# Patient Record
Sex: Male | Born: 1940 | Race: White | Hispanic: No | Marital: Single | Smoking: Former smoker
Health system: Southern US, Community
[De-identification: ages and names within clinical notes are randomized; demographics above are authoritative.]

## PROBLEM LIST (undated history)

## (undated) DIAGNOSIS — C801 Malignant (primary) neoplasm, unspecified: Secondary | ICD-10-CM

## (undated) DIAGNOSIS — I509 Heart failure, unspecified: Secondary | ICD-10-CM

## (undated) DIAGNOSIS — R0600 Dyspnea, unspecified: Secondary | ICD-10-CM

## (undated) DIAGNOSIS — N289 Disorder of kidney and ureter, unspecified: Secondary | ICD-10-CM

## (undated) DIAGNOSIS — J449 Chronic obstructive pulmonary disease, unspecified: Secondary | ICD-10-CM

## (undated) HISTORY — PX: DIALYSIS FISTULA CREATION: SHX611

## (undated) HISTORY — PX: PROSTATE BIOPSY: SHX241

---

## 2017-08-26 ENCOUNTER — Inpatient Hospital Stay (HOSPITAL_COMMUNITY)
Admission: EM | Admit: 2017-08-26 | Discharge: 2017-08-28 | DRG: 189 | Disposition: A | Discharge status: Home / Self Care | Payer: Medicare Other | Attending: Nephrology | Admitting: Nephrology

## 2017-08-26 ENCOUNTER — Emergency Department (HOSPITAL_COMMUNITY): Payer: Medicare Other

## 2017-08-26 ENCOUNTER — Encounter (HOSPITAL_COMMUNITY): Payer: Self-pay | Admitting: Emergency Medicine

## 2017-08-26 DIAGNOSIS — R0602 Shortness of breath: Secondary | ICD-10-CM | POA: Diagnosis not present

## 2017-08-26 DIAGNOSIS — I2510 Atherosclerotic heart disease of native coronary artery without angina pectoris: Secondary | ICD-10-CM | POA: Diagnosis present

## 2017-08-26 DIAGNOSIS — Z7982 Long term (current) use of aspirin: Secondary | ICD-10-CM

## 2017-08-26 DIAGNOSIS — J449 Chronic obstructive pulmonary disease, unspecified: Secondary | ICD-10-CM | POA: Diagnosis present

## 2017-08-26 DIAGNOSIS — Z87891 Personal history of nicotine dependence: Secondary | ICD-10-CM

## 2017-08-26 DIAGNOSIS — I509 Heart failure, unspecified: Secondary | ICD-10-CM

## 2017-08-26 DIAGNOSIS — Z8249 Family history of ischemic heart disease and other diseases of the circulatory system: Secondary | ICD-10-CM

## 2017-08-26 DIAGNOSIS — I10 Essential (primary) hypertension: Secondary | ICD-10-CM

## 2017-08-26 DIAGNOSIS — J810 Acute pulmonary edema: Principal | ICD-10-CM | POA: Diagnosis present

## 2017-08-26 DIAGNOSIS — Z79899 Other long term (current) drug therapy: Secondary | ICD-10-CM

## 2017-08-26 DIAGNOSIS — N186 End stage renal disease: Secondary | ICD-10-CM | POA: Diagnosis present

## 2017-08-26 DIAGNOSIS — I132 Hypertensive heart and chronic kidney disease with heart failure and with stage 5 chronic kidney disease, or end stage renal disease: Secondary | ICD-10-CM | POA: Diagnosis present

## 2017-08-26 DIAGNOSIS — J9690 Respiratory failure, unspecified, unspecified whether with hypoxia or hypercapnia: Secondary | ICD-10-CM | POA: Diagnosis present

## 2017-08-26 DIAGNOSIS — Z7951 Long term (current) use of inhaled steroids: Secondary | ICD-10-CM

## 2017-08-26 DIAGNOSIS — Z992 Dependence on renal dialysis: Secondary | ICD-10-CM

## 2017-08-26 DIAGNOSIS — J811 Chronic pulmonary edema: Secondary | ICD-10-CM | POA: Diagnosis present

## 2017-08-26 DIAGNOSIS — C3491 Malignant neoplasm of unspecified part of right bronchus or lung: Secondary | ICD-10-CM | POA: Diagnosis not present

## 2017-08-26 DIAGNOSIS — J9601 Acute respiratory failure with hypoxia: Secondary | ICD-10-CM | POA: Diagnosis present

## 2017-08-26 HISTORY — DX: Dyspnea, unspecified: R06.00

## 2017-08-26 HISTORY — DX: Disorder of kidney and ureter, unspecified: N28.9

## 2017-08-26 HISTORY — DX: Heart failure, unspecified: I50.9

## 2017-08-26 HISTORY — DX: Chronic obstructive pulmonary disease, unspecified: J44.9

## 2017-08-26 HISTORY — DX: Malignant (primary) neoplasm, unspecified: C80.1

## 2017-08-26 LAB — COMPREHENSIVE METABOLIC PANEL
ALT: 11 U/L — ABNORMAL LOW (ref 17–63)
AST: 18 U/L (ref 15–41)
Albumin: 3.9 g/dL (ref 3.5–5.0)
Alkaline Phosphatase: 115 U/L (ref 38–126)
Anion gap: 14 (ref 5–15)
BILIRUBIN TOTAL: 0.6 mg/dL (ref 0.3–1.2)
BUN: 36 mg/dL — ABNORMAL HIGH (ref 6–20)
CHLORIDE: 103 mmol/L (ref 101–111)
CO2: 22 mmol/L (ref 22–32)
CREATININE: 7.23 mg/dL — AB (ref 0.61–1.24)
Calcium: 8.8 mg/dL — ABNORMAL LOW (ref 8.9–10.3)
GFR calc non Af Amer: 6 mL/min — ABNORMAL LOW (ref >60)
GFR, EST AFRICAN AMERICAN: 7 mL/min — AB (ref >60)
Glucose, Bld: 123 mg/dL — ABNORMAL HIGH (ref 65–99)
POTASSIUM: 3.8 mmol/L (ref 3.5–5.1)
Sodium: 139 mmol/L (ref 135–145)
TOTAL PROTEIN: 7.1 g/dL (ref 6.5–8.1)

## 2017-08-26 LAB — CBC WITH DIFFERENTIAL/PLATELET
Basophils Absolute: 0.1 10*3/uL (ref 0.0–0.1)
Basophils Relative: 1 %
EOS PCT: 4 %
Eosinophils Absolute: 0.3 10*3/uL (ref 0.0–0.7)
HCT: 31.1 % — ABNORMAL LOW (ref 39.0–52.0)
Hemoglobin: 10.6 g/dL — ABNORMAL LOW (ref 13.0–17.0)
LYMPHS ABS: 0.7 10*3/uL (ref 0.7–4.0)
Lymphocytes Relative: 9 %
MCH: 29.9 pg (ref 26.0–34.0)
MCHC: 34.1 g/dL (ref 30.0–36.0)
MCV: 87.9 fL (ref 78.0–100.0)
MONO ABS: 0.3 10*3/uL (ref 0.1–1.0)
MONOS PCT: 4 %
Neutro Abs: 6 10*3/uL (ref 1.7–7.7)
Neutrophils Relative %: 82 %
PLATELETS: 160 10*3/uL (ref 150–400)
RBC: 3.54 MIL/uL — ABNORMAL LOW (ref 4.22–5.81)
RDW: 15.9 % — AB (ref 11.5–15.5)
WBC: 7.4 10*3/uL (ref 4.0–10.5)

## 2017-08-26 LAB — I-STAT CHEM 8, ED
BUN: 40 mg/dL — AB (ref 6–20)
CHLORIDE: 103 mmol/L (ref 101–111)
Calcium, Ion: 1.05 mmol/L — ABNORMAL LOW (ref 1.15–1.40)
Creatinine, Ser: 7.4 mg/dL — ABNORMAL HIGH (ref 0.61–1.24)
Glucose, Bld: 120 mg/dL — ABNORMAL HIGH (ref 65–99)
HEMATOCRIT: 30 % — AB (ref 39.0–52.0)
Hemoglobin: 10.2 g/dL — ABNORMAL LOW (ref 13.0–17.0)
Potassium: 3.8 mmol/L (ref 3.5–5.1)
SODIUM: 141 mmol/L (ref 135–145)
TCO2: 27 mmol/L (ref 22–32)

## 2017-08-26 MED ORDER — ALBUTEROL SULFATE (2.5 MG/3ML) 0.083% IN NEBU: 5.0000 mg | INHALATION_SOLUTION | Freq: Once | RESPIRATORY_TRACT | Status: DC

## 2017-08-26 NOTE — ED Triage Notes (Signed)
Pt presents EMS for shortness of breath. Pt is dialysis and goes MWF has not missed a treatment. Pt states started this AM and has gotten worse with exertional dyspnea and increased edema

## 2017-08-26 NOTE — ED Notes (Signed)
Patient transported to X-ray 

## 2017-08-26 NOTE — H&P (Signed)
[facility]                                                                                    Patient Demographics  Patrick Molina, is a 76 y.o. male  CSN: [id number]  MRN: [id number]  DOB - 10/14/1941  Admit Date - 08/26/2017  Outpatient Primary MD for the patient is No primary care provider on file.   With History of -  Past Medical History:  Diagnosis Date  . Cancer ([place])   . CHF (congestive heart failure) ([place])   . COPD (chronic obstructive pulmonary disease) ([place])   . Renal disorder       No past surgical history on file.  in for   Chief Complaint  Patient presents with  . Shortness of Breath     HPI  Patrick Molina  is a 76 y.o. male, With past medical history significant for end-stage renal disease on hemodialysis Monday Wednesday Friday presenting one day history of increasing shortness of breath and increased leg swelling. Patient denies consuming excessive amounts of fluid or alcohol. Patient denies any chest pains, nausea, vomiting, fever or chills. Patient denies cough. He usually follows at [facility] which was on divert. His shortness of breath improved significantly on Ventimask .    Review of Systems    In addition to the HPI above,  No Fever-chills, No Headache, No changes with Vision or hearing, No problems swallowing food or Liquids, No Chest pain, Cough  No Abdominal pain, No Nausea or Vommitting, Bowel movements are regular, No Blood in stool or Urine, No dysuria, No new skin rashes or bruises, No new joints pains-aches,  No new weakness, tingling, numbness in any extremity, No polyuria, polydypsia or polyphagia, No significant Mental Stressors.  A full 10 point Review of Systems was done, except as stated above, all other Review of Systems were negative.   Social History History of smoking, stopped 4 years ago No history of alcoholism or drug abuse   Family History Hypertension and heart disease   Prior to Admission  medications   Medication Sig Start Date End Date Taking? Authorizing Provider  albuterol (PROVENTIL HFA;VENTOLIN HFA) 108 (90 Base) MCG/ACT inhaler Inhale 2 puffs into the lungs every 6 (six) hours as needed for wheezing or shortness of breath.   Yes [provider]  amLODipine (NORVASC) 10 MG tablet Take 10 mg by mouth daily. 08/22/17  Yes [provider]  aspirin EC 81 MG tablet Take 81 mg by mouth daily.   Yes [provider]  busPIRone (BUSPAR) 5 MG tablet Take 5 mg by mouth 2 (two) times daily. 08/14/17  Yes [provider]  carvedilol (COREG) 6.25 MG tablet Take 6.25 mg by mouth 2 (two) times daily. 08/09/17  Yes [provider]  doxazosin (CARDURA) 4 MG tablet Take 4 mg by mouth at bedtime. 07/16/17  Yes [provider]  ferric citrate (AURYXIA) 1 GM 210 MG(Fe) tablet Take 420 mg by mouth daily.   Yes [provider]  fluticasone furoate-vilanterol (BREO ELLIPTA) 100-25 MCG/INH AEPB Inhale 1 puff into the lungs as needed.    Yes [provider]  furosemide (  LASIX) 40 MG tablet Take 40 mg by mouth daily. 08/09/17  Yes [provider]  isosorbide mononitrate (IMDUR) 120 MG 24 hr tablet Take 120 mg by mouth daily. 08/07/17  Yes [provider]  LORazepam (ATIVAN) 0.5 MG tablet Take 0.5 mg by mouth daily as needed. 08/23/17  Yes [provider]  losartan (COZAAR) 100 MG tablet Take 100 mg by mouth daily. 07/11/17  Yes [provider]  pantoprazole (PROTONIX) 40 MG tablet Take 40 mg by mouth daily. 08/09/17  Yes [provider]  SYMBICORT 80-4.5 MCG/ACT inhaler Inhale 2 puffs into the lungs daily as needed. 08/09/17  Yes [provider]  tamsulosin (FLOMAX) 0.4 MG CAPS capsule Take 0.4 mg by mouth daily. d 07/08/17  Yes [provider]  temazepam (RESTORIL) 15 MG capsule Take 15 mg by mouth at bedtime. 08/20/17  Yes [provider]    Allergies  Allergen Reactions   . Aricept [Donepezil Hcl] Other (See Comments)    Makes him crazy  . Haldol [Haloperidol Lactate]   . Prednisone     Physical Exam  Vitals  Blood pressure (!) 180/72, pulse 94, temperature (!) 97.5 F (36.4 C), temperature source Oral, resp. rate (!) 31, height 5\' 5"  (1.651 m), weight 66.7 kg (147 lb), SpO2 100 %.   1. General A very pleasant gentleman in moderate shortness of breath  2. Normal affect and insight, Not Suicidal or Homicidal, Awake Alert, Oriented X 3.  3. No F.N deficits, grossly, patient moving all extremities.  4. Ears and Eyes appear Normal, Conjunctivae clear, PERRLA. Moist Oral Mucosa.  5. Supple Neck, No JVD, No cervical lymphadenopathy appriciated, No Carotid Bruits.  6. Symmetrical Chest wall movement, Good air movement bilaterally, bilateral basilar crackles with decreased breath sounds  7. RRR, No Gallops, Rubs or Murmurs, No Parasternal Heave.  8. Positive Bowel Sounds, Abdomen Soft, Non tender, No organomegaly appriciated,No rebound -guarding or rigidity.  9.  No Cyanosis, Normal Skin Turgor, No Skin Rash or Bruise.  10. Good muscle tone, lower extremity +1 edema  Data Review  CBC  Recent Labs Lab 08/26/17 2127 08/26/17 2139  WBC 7.4  --   HGB 10.6* 10.2*  HCT 31.1* 30.0*  PLT 160  --   MCV 87.9  --   MCH 29.9  --   MCHC 34.1  --   RDW 15.9*  --   LYMPHSABS 0.7  --   MONOABS 0.3  --   EOSABS 0.3  --   BASOSABS 0.1  --    ------------------------------------------------------------------------------------------------------------------  Chemistries   Recent Labs Lab 08/26/17 2127 08/26/17 2139  NA 139 141  K 3.8 3.8  CL 103 103  CO2 22  --   GLUCOSE 123* 120*  BUN 36* 40*  CREATININE 7.23* 7.40*  CALCIUM 8.8*  --   AST 18  --   ALT 11*  --   ALKPHOS 115  --   BILITOT 0.6  --    ------------------------------------------------------------------------------------------------------------------ estimated creatinine  clearance is 7.3 mL/min (A) (by C-G formula based on SCr of 7.4 mg/dL (H)). ------------------------------------------------------------------------------------------------------------------ No results for input(s): TSH, T4TOTAL, T3FREE, THYROIDAB in the last 72 hours.  Invalid input(s): FREET3   Coagulation profile No results for input(s): INR, PROTIME in the last 168 hours. ------------------------------------------------------------------------------------------------------------------- No results for input(s): DDIMER in the last 72 hours. -------------------------------------------------------------------------------------------------------------------  Cardiac Enzymes No results for input(s): CKMB, TROPONINI, MYOGLOBIN in the last 168 hours.  Invalid input(s): CK ------------------------------------------------------------------------------------------------------------------ Invalid input(s): POCBNP   ---------------------------------------------------------------------------------------------------------------  Urinalysis No results found for: COLORURINE, APPEARANCEUR, [place], [place], [place], [place], [place], [place], PROTEINUR, UROBILINOGEN, NITRITE, LEUKOCYTESUR  ----------------------------------------------------------------------------------------------------------------  Imaging results:   Dg Chest 2 View  Result Date: 08/26/2017 CLINICAL DATA:  Shortness of breath EXAM: CHEST  2 VIEW COMPARISON:  PET-CT 08/08/2017, chest radiograph 07/29/2017 FINDINGS: Right upper lobe pulmonary nodule measuring 2.1 cm. Small bilateral pleural effusions. Mild cardiomegaly with diffuse interstitial and alveolar opacity which may reflect edema. Unable to exclude bibasilar pneumonia. Aortic atherosclerosis. No pneumothorax. Right subclavian vascular stent IMPRESSION: 1. Mild cardiomegaly with bilateral pleural effusions. Right greater than left interstitial and alveolar opacities  suspect for edema. Unable to exclude bibasilar pneumonia 2. Stable right upper lobe pulmonary mass, please see prior PET-CT report Electronically Signed   By: Donavan Foil M.D.   On: 08/26/2017 20:53    My personal review of EKG: Rhythm NSR, 97 beats per minutes with nonspecific ST changes and old inferior MI    Assessment & Plan  1. Respiratory failure, multifactorial with pulmonary edema, decompensated renal failure and lung cancer 2. Right lung cancer; not a candidate for workup due to metastasis, age and comorbidities 3. COPD; stop smoking 4 years ago  Plan   Place on observation for hemodialysis Neb treatments ? Steroids    DVT Prophylaxis heparin  AM Labs Ordered, also please review Full Orders    Code Status full  Disposition Plan: Home  Time spent in minutes : 34 minutes  Condition GUARDED   @SIGNATURE @

## 2017-08-26 NOTE — ED Notes (Signed)
Hinton Dyer [phone] Jackelyn Poling [phone]

## 2017-08-26 NOTE — Consult Note (Signed)
Renal Service Consult Note [facility]  Sheena Simonis 08/26/2017 [place] D Requesting Physician:  Dr Alyson Locket, ED MD  Reason for Consult:  ESRD pt with pulm edema HPI: The patient is a 76 y.o. year-old with hx of COPD, CHF and ESRD on HD, presents to ED tonight with SOB x 1-2 days.  He goes to HD on MWF in [place], has not missed HD. In ED pt desatting off of oxygen, CXR showing diffuse pulm edema.  Asked to see for HD.    PT denies any CP, fevers, hemoptysis, no abd pain, n/v/d, no confusion.  Pt lives in [place], [place], lives alone, a daughter lives nearby.  On HD x 4 years, RUE AVF, hx cor stent x 2,and recently diagnosed w/ probable lung Ca by PET scan.  He is supposed to meet with some other doctors soon to discuss next steps.  His nephrologist is Dr Brayton El.  Quit smoking 4 yrs ago.  May have COPD.    Patient says recently that they have "only been pulling off 1-2 lbs" at his HD sessions.      ROS  denies CP  no joint pain   no HA  no blurry vision  no rash  no diarrhea  no nausea/ vomiting   Past Medical History  Past Medical History:  Diagnosis Date  . Cancer ([place])   . CHF (congestive heart failure) ([place])   . COPD (chronic obstructive pulmonary disease) ([place])   . Renal disorder    Past Surgical History No past surgical history on file. Family History No family history on file. Social History  has no tobacco, alcohol, and drug history on file. Allergies  Allergies  Allergen Reactions  . Aricept [Donepezil Hcl] Other (See Comments)    Makes him crazy  . Haldol [Haloperidol Lactate]   . Prednisone    Home medications Prior to Admission medications   Medication Sig Start Date End Date Taking? Authorizing Provider  albuterol (PROVENTIL HFA;VENTOLIN HFA) 108 (90 Base) MCG/ACT inhaler Inhale 2 puffs into the lungs every 6 (six) hours as needed for wheezing or shortness of breath.   Yes [provider]  amLODipine (NORVASC) 10 MG tablet  Take 10 mg by mouth daily. 08/22/17  Yes [provider]  aspirin EC 81 MG tablet Take 81 mg by mouth daily.   Yes [provider]  busPIRone (BUSPAR) 5 MG tablet Take 5 mg by mouth 2 (two) times daily. 08/14/17  Yes [provider]  carvedilol (COREG) 6.25 MG tablet Take 6.25 mg by mouth 2 (two) times daily. 08/09/17  Yes [provider]  doxazosin (CARDURA) 4 MG tablet Take 4 mg by mouth at bedtime. 07/16/17  Yes [provider]  ferric citrate (AURYXIA) 1 GM 210 MG(Fe) tablet Take 420 mg by mouth daily.   Yes [provider]  fluticasone furoate-vilanterol (BREO ELLIPTA) 100-25 MCG/INH AEPB Inhale 1 puff into the lungs as needed.    Yes [provider]  furosemide (LASIX) 40 MG tablet Take 40 mg by mouth daily. 08/09/17  Yes [provider]  isosorbide mononitrate (IMDUR) 120 MG 24 hr tablet Take 120 mg by mouth daily. 08/07/17  Yes [provider]  LORazepam (ATIVAN) 0.5 MG tablet Take 0.5 mg by mouth daily as needed. 08/23/17  Yes [provider]  losartan (COZAAR) 100 MG tablet Take 100 mg by mouth daily. 07/11/17  Yes [provider]  pantoprazole (PROTONIX) 40 MG tablet Take 40 mg by mouth  daily. 08/09/17  Yes [provider]  SYMBICORT 80-4.5 MCG/ACT inhaler Inhale 2 puffs into the lungs daily as needed. 08/09/17  Yes [provider]  tamsulosin (FLOMAX) 0.4 MG CAPS capsule Take 0.4 mg by mouth daily. d 07/08/17  Yes [provider]  temazepam (RESTORIL) 15 MG capsule Take 15 mg by mouth at bedtime. 08/20/17  Yes [provider]   Liver Function Tests  Recent Labs Lab 08/26/17 2127  AST 18  ALT 11*  ALKPHOS 115  BILITOT 0.6  PROT 7.1  ALBUMIN 3.9   No results for input(s): LIPASE, AMYLASE in the last 168 hours. CBC  Recent Labs Lab 08/26/17 2127 08/26/17 2139  WBC 7.4  --   NEUTROABS 6.0  --   HGB 10.6* 10.2*  HCT 31.1* 30.0*  MCV 87.9  --   PLT 160   --    Basic Metabolic Panel  Recent Labs Lab 08/26/17 2127 08/26/17 2139  NA 139 141  K 3.8 3.8  CL 103 103  CO2 22  --   GLUCOSE 123* 120*  BUN 36* 40*  CREATININE 7.23* 7.40*  CALCIUM 8.8*  --    Iron/TIBC/Ferritin/ %Sat No results found for: IRON, TIBC, FERRITIN, IRONPCTSAT  Vitals:   08/26/17 1953 08/26/17 1955 08/26/17 2000 08/26/17 2015  BP:  (!) 180/81 (!) 183/69 (!) 180/72  Pulse:  97 99 94  Resp:  (!) 26 (!) 28 (!) 31  Temp:  (!) 97.5 F (36.4 C)    TempSrc:  Oral    SpO2:  100% 100% 100%  Weight: 66.7 kg (147 lb)     Height: 5\' 5"  (1.651 m)      Exam Gen alert, on FM O2, not in distress No rash, cyanosis or gangrene Sclera anicteric, throat clear  +JVD, no bruits Chest clear bilat bilat rales 1/3 up RRR no MRG Abd soft ntnd no mass or ascites +bs GU normal male MS no joint effusions or deformity Ext 1+ bilat LE edema / no wounds or ulcers Neuro is alert, Ox 3 , nf  Home meds: - norvasc/ coreg 6.25 bid/ lasix 40 qd/ losartan 100 qd/ cardura 4 hs - alb nebs/ breo ellipta qd/ symbicort inhaler - restoril 15 hs/ ativan 0.5 prn/ buspar 5 bid - ecasa/ imdur 120 qd - auryxia ac/ PPI/ flomax  Dialysis: MWF [facility]  Dry wt 147 lbs per pt    Impression: 1.  Dyspnea/ pulm edema - good HD compliance, suspect pt is losing body weight.  Plan is for HD tonight, max UF, reassess in am.  Pt to be admitted per medical team.   2.  ESRD on HD 3.  Recent dx of possible lung Ca - per PET scan according to pt 4.  CAD hx cor stent x 2 5.  COPD - on mult inhalers 6.  HTN - on 4 BP meds    Plan - as above  Kelly Splinter MD [employer] pager [phone]   08/26/2017, 11:26 PM

## 2017-08-27 ENCOUNTER — Encounter (HOSPITAL_COMMUNITY): Payer: Self-pay | Admitting: *Deleted

## 2017-08-27 DIAGNOSIS — Z7951 Long term (current) use of inhaled steroids: Secondary | ICD-10-CM | POA: Diagnosis not present

## 2017-08-27 DIAGNOSIS — R0602 Shortness of breath: Secondary | ICD-10-CM | POA: Diagnosis present

## 2017-08-27 DIAGNOSIS — J810 Acute pulmonary edema: Secondary | ICD-10-CM | POA: Diagnosis present

## 2017-08-27 DIAGNOSIS — J449 Chronic obstructive pulmonary disease, unspecified: Secondary | ICD-10-CM | POA: Diagnosis present

## 2017-08-27 DIAGNOSIS — Z8249 Family history of ischemic heart disease and other diseases of the circulatory system: Secondary | ICD-10-CM | POA: Diagnosis not present

## 2017-08-27 DIAGNOSIS — Z87891 Personal history of nicotine dependence: Secondary | ICD-10-CM | POA: Diagnosis not present

## 2017-08-27 DIAGNOSIS — J9690 Respiratory failure, unspecified, unspecified whether with hypoxia or hypercapnia: Secondary | ICD-10-CM | POA: Diagnosis present

## 2017-08-27 DIAGNOSIS — Z79899 Other long term (current) drug therapy: Secondary | ICD-10-CM | POA: Diagnosis not present

## 2017-08-27 DIAGNOSIS — I509 Heart failure, unspecified: Secondary | ICD-10-CM | POA: Diagnosis present

## 2017-08-27 DIAGNOSIS — I132 Hypertensive heart and chronic kidney disease with heart failure and with stage 5 chronic kidney disease, or end stage renal disease: Secondary | ICD-10-CM | POA: Diagnosis present

## 2017-08-27 DIAGNOSIS — J9601 Acute respiratory failure with hypoxia: Secondary | ICD-10-CM

## 2017-08-27 DIAGNOSIS — Z992 Dependence on renal dialysis: Secondary | ICD-10-CM | POA: Diagnosis not present

## 2017-08-27 DIAGNOSIS — Z7982 Long term (current) use of aspirin: Secondary | ICD-10-CM | POA: Diagnosis not present

## 2017-08-27 DIAGNOSIS — I10 Essential (primary) hypertension: Secondary | ICD-10-CM | POA: Diagnosis not present

## 2017-08-27 DIAGNOSIS — C3491 Malignant neoplasm of unspecified part of right bronchus or lung: Secondary | ICD-10-CM | POA: Diagnosis present

## 2017-08-27 DIAGNOSIS — I2510 Atherosclerotic heart disease of native coronary artery without angina pectoris: Secondary | ICD-10-CM | POA: Diagnosis present

## 2017-08-27 DIAGNOSIS — J811 Chronic pulmonary edema: Secondary | ICD-10-CM | POA: Diagnosis present

## 2017-08-27 DIAGNOSIS — N186 End stage renal disease: Secondary | ICD-10-CM | POA: Diagnosis present

## 2017-08-27 LAB — BASIC METABOLIC PANEL
Anion gap: 11 (ref 5–15)
BUN: 9 mg/dL (ref 6–20)
CHLORIDE: 97 mmol/L — AB (ref 101–111)
CO2: 29 mmol/L (ref 22–32)
Calcium: 8.7 mg/dL — ABNORMAL LOW (ref 8.9–10.3)
Creatinine, Ser: 3.09 mg/dL — ABNORMAL HIGH (ref 0.61–1.24)
GFR calc non Af Amer: 18 mL/min — ABNORMAL LOW (ref >60)
GFR, EST AFRICAN AMERICAN: 21 mL/min — AB (ref >60)
Glucose, Bld: 92 mg/dL (ref 65–99)
POTASSIUM: 3.9 mmol/L (ref 3.5–5.1)
SODIUM: 137 mmol/L (ref 135–145)

## 2017-08-27 LAB — CBC
HEMATOCRIT: 33.2 % — AB (ref 39.0–52.0)
HEMOGLOBIN: 11.2 g/dL — AB (ref 13.0–17.0)
MCH: 29.2 pg (ref 26.0–34.0)
MCHC: 33.7 g/dL (ref 30.0–36.0)
MCV: 86.7 fL (ref 78.0–100.0)
Platelets: 164 10*3/uL (ref 150–400)
RBC: 3.83 MIL/uL — AB (ref 4.22–5.81)
RDW: 15.6 % — ABNORMAL HIGH (ref 11.5–15.5)
WBC: 7.8 10*3/uL (ref 4.0–10.5)

## 2017-08-27 LAB — TSH: TSH: 1.704 u[IU]/mL (ref 0.350–4.500)

## 2017-08-27 MED ORDER — ISOSORBIDE MONONITRATE ER 60 MG PO TB24
120.0000 mg | ORAL_TABLET | Freq: Every day | ORAL | Status: DC
  Administered 2017-08-27 – 2017-08-28 (×2): 120 mg via ORAL
  Filled 2017-08-27: qty 4
  Filled 2017-08-27: qty 2
  Filled 2017-08-27: qty 4

## 2017-08-27 MED ORDER — ALTEPLASE 2 MG IJ SOLR: 2.0000 mg | Freq: Once | INTRAMUSCULAR | Status: DC | PRN

## 2017-08-27 MED ORDER — CARVEDILOL 6.25 MG PO TABS
6.2500 mg | ORAL_TABLET | Freq: Two times a day (BID) | ORAL | Status: DC
  Administered 2017-08-27 (×2): 6.25 mg via ORAL
  Filled 2017-08-27 (×2): qty 1

## 2017-08-27 MED ORDER — LOSARTAN POTASSIUM 50 MG PO TABS
100.0000 mg | ORAL_TABLET | Freq: Every day | ORAL | Status: DC
  Administered 2017-08-27: 100 mg via ORAL
  Filled 2017-08-27 (×2): qty 2

## 2017-08-27 MED ORDER — BUSPIRONE HCL 5 MG PO TABS
5.0000 mg | ORAL_TABLET | Freq: Two times a day (BID) | ORAL | Status: DC
  Administered 2017-08-27 – 2017-08-28 (×3): 5 mg via ORAL
  Filled 2017-08-27: qty 1
  Filled 2017-08-27 (×3): qty 0.5

## 2017-08-27 MED ORDER — PANTOPRAZOLE SODIUM 40 MG PO TBEC
40.0000 mg | DELAYED_RELEASE_TABLET | Freq: Every day | ORAL | Status: DC
  Administered 2017-08-27 – 2017-08-28 (×2): 40 mg via ORAL
  Filled 2017-08-27 (×2): qty 1

## 2017-08-27 MED ORDER — ALBUTEROL SULFATE (2.5 MG/3ML) 0.083% IN NEBU
2.5000 mg | INHALATION_SOLUTION | RESPIRATORY_TRACT | Status: DC | PRN
  Administered 2017-08-27: 2.5 mg via RESPIRATORY_TRACT
  Filled 2017-08-27: qty 3

## 2017-08-27 MED ORDER — ACETAMINOPHEN 325 MG PO TABS: 650.0000 mg | ORAL_TABLET | Freq: Four times a day (QID) | ORAL | Status: DC | PRN

## 2017-08-27 MED ORDER — FLUTICASONE FUROATE-VILANTEROL 100-25 MCG/INH IN AEPB
1.0000 | INHALATION_SPRAY | Freq: Every day | RESPIRATORY_TRACT | Status: DC
  Administered 2017-08-27: 10:00:00 1 via RESPIRATORY_TRACT
  Filled 2017-08-27: qty 28

## 2017-08-27 MED ORDER — LORAZEPAM 1 MG PO TABS
0.5000 mg | ORAL_TABLET | Freq: Once | ORAL | Status: AC
  Administered 2017-08-27: 0.5 mg via ORAL
  Filled 2017-08-27: qty 1

## 2017-08-27 MED ORDER — SODIUM CHLORIDE 0.9 % IV SOLN: 100.0000 mL | INTRAVENOUS | Status: DC | PRN

## 2017-08-27 MED ORDER — TAMSULOSIN HCL 0.4 MG PO CAPS
0.4000 mg | ORAL_CAPSULE | Freq: Every day | ORAL | Status: DC
  Administered 2017-08-27 – 2017-08-28 (×2): 0.4 mg via ORAL
  Filled 2017-08-27 (×2): qty 1

## 2017-08-27 MED ORDER — FUROSEMIDE 40 MG PO TABS
40.0000 mg | ORAL_TABLET | Freq: Every day | ORAL | Status: DC
  Administered 2017-08-27 – 2017-08-28 (×2): 40 mg via ORAL
  Filled 2017-08-27: qty 1
  Filled 2017-08-27: qty 2

## 2017-08-27 MED ORDER — ACETAMINOPHEN 650 MG RE SUPP: 650.0000 mg | Freq: Four times a day (QID) | RECTAL | Status: DC | PRN

## 2017-08-27 MED ORDER — HEPARIN SODIUM (PORCINE) 1000 UNIT/ML DIALYSIS
3000.0000 [IU] | INTRAMUSCULAR | Status: DC | PRN
  Administered 2017-08-27: 3000 [IU] via INTRAVENOUS_CENTRAL

## 2017-08-27 MED ORDER — INFLUENZA VAC SPLIT HIGH-DOSE 0.5 ML IM SUSY
0.5000 mL | PREFILLED_SYRINGE | INTRAMUSCULAR | Status: DC
  Filled 2017-08-27: qty 0.5

## 2017-08-27 MED ORDER — HEPARIN SODIUM (PORCINE) 5000 UNIT/ML IJ SOLN
5000.0000 [IU] | Freq: Three times a day (TID) | INTRAMUSCULAR | Status: DC
  Administered 2017-08-27 – 2017-08-28 (×3): 5000 [IU] via SUBCUTANEOUS
  Filled 2017-08-27 (×2): qty 1

## 2017-08-27 MED ORDER — PENTAFLUOROPROP-TETRAFLUOROETH EX AERO: 1.0000 "application " | INHALATION_SPRAY | CUTANEOUS | Status: DC | PRN

## 2017-08-27 MED ORDER — OXYCODONE HCL 5 MG PO TABS
10.0000 mg | ORAL_TABLET | Freq: Once | ORAL | Status: AC
  Administered 2017-08-27: 10 mg via ORAL

## 2017-08-27 MED ORDER — LIDOCAINE-PRILOCAINE 2.5-2.5 % EX CREA: 1.0000 "application " | TOPICAL_CREAM | CUTANEOUS | Status: DC | PRN

## 2017-08-27 MED ORDER — MOMETASONE FURO-FORMOTEROL FUM 100-5 MCG/ACT IN AERO: 2.0000 | INHALATION_SPRAY | Freq: Two times a day (BID) | RESPIRATORY_TRACT | Status: DC

## 2017-08-27 MED ORDER — DOXAZOSIN MESYLATE 2 MG PO TABS
4.0000 mg | ORAL_TABLET | Freq: Every day | ORAL | Status: DC
  Administered 2017-08-27: 4 mg via ORAL
  Filled 2017-08-27: qty 2
  Filled 2017-08-27: qty 1

## 2017-08-27 MED ORDER — LIDOCAINE HCL (PF) 1 % IJ SOLN: 5.0000 mL | INTRAMUSCULAR | Status: DC | PRN

## 2017-08-27 MED ORDER — FERRIC CITRATE 1 GM 210 MG(FE) PO TABS
420.0000 mg | ORAL_TABLET | Freq: Every day | ORAL | Status: DC
  Administered 2017-08-27: 420 mg via ORAL
  Filled 2017-08-27 (×2): qty 2

## 2017-08-27 MED ORDER — LORAZEPAM 0.5 MG PO TABS
0.5000 mg | ORAL_TABLET | Freq: Every day | ORAL | Status: DC | PRN
  Administered 2017-08-27 – 2017-08-28 (×2): 0.5 mg via ORAL
  Filled 2017-08-27 (×2): qty 1

## 2017-08-27 MED ORDER — TEMAZEPAM 7.5 MG PO CAPS
15.0000 mg | ORAL_CAPSULE | Freq: Every day | ORAL | Status: DC
  Administered 2017-08-27: 15 mg via ORAL
  Filled 2017-08-27: qty 2

## 2017-08-27 MED ORDER — OXYCODONE HCL 5 MG PO TABS
ORAL_TABLET | ORAL | Status: AC
  Administered 2017-08-27: 10 mg via ORAL
  Filled 2017-08-27: qty 2

## 2017-08-27 MED ORDER — AMLODIPINE BESYLATE 10 MG PO TABS
10.0000 mg | ORAL_TABLET | Freq: Every day | ORAL | Status: DC
  Administered 2017-08-27: 10 mg via ORAL
  Filled 2017-08-27 (×2): qty 2
  Filled 2017-08-27: qty 1

## 2017-08-27 MED ORDER — ALBUTEROL SULFATE HFA 108 (90 BASE) MCG/ACT IN AERS: 2.0000 | INHALATION_SPRAY | Freq: Four times a day (QID) | RESPIRATORY_TRACT | Status: DC | PRN

## 2017-08-27 MED ORDER — ASPIRIN EC 81 MG PO TBEC
81.0000 mg | DELAYED_RELEASE_TABLET | Freq: Every day | ORAL | Status: DC
  Administered 2017-08-27 – 2017-08-28 (×2): 81 mg via ORAL
  Filled 2017-08-27 (×2): qty 1

## 2017-08-27 MED ORDER — HEPARIN SODIUM (PORCINE) 1000 UNIT/ML DIALYSIS: 1000.0000 [IU] | INTRAMUSCULAR | Status: DC | PRN

## 2017-08-27 NOTE — Progress Notes (Signed)
Patient resting comfortably on 4L Patrick Molina.  Sating 100%.    Will continue to monitor.

## 2017-08-27 NOTE — Progress Notes (Signed)
PROGRESS NOTE    Patrick Molina  QMG:[id number] DOB: 10-27-1941 DOA: 08/26/2017 PCP: Patient, No Pcp Per   Brief Narrative: 76 year old male with recent diagnosis of possible lung cancer, ESRD on hemodialysis, hypertension, coronary artery disease, COPD not on oxygen, presented with shortness of breath and dyspnea on exertion consistent with pulmonary edema.  Assessment & Plan:   Active Problems:   Pulmonary edema   Respiratory failure ([place])  # Dyspnea/acute pulmonary edema: Reports good compliance with the hemodialysis. Likely needs to adjust her dry weight. Patient received hemodialysis treatment last night with feeling better. Patient had echocardiogram at [facility] health care on 8/21 with mild concentric left ventricle hypertrophy, EF 50-55%. -Likely hemodialysis treatment tomorrow. Nephrology consult appreciated  #Acute respiratory failure with hypoxia: In the setting of pulmonary edema/fluid overload. Already feeling better. Requiring about 2 L of oxygen. Try to wean down gradually. On admission patient required about 6 L of oxygen. Plan for another hemodialysis tomorrow. Educated on low salt diet.  #Right lung cancer and had PET scan recently. Recommended to follow-up outpatient.  #COPD: Stable. Continue nebulizer and breathing treatment. Not on oxygen.  #Hypertension: Likely volume mediated. Continue amlodipine, Coreg, Cardura, Imdur, Cozaar. Monitor blood pressure closely. Avoid hypotension.  DVT prophylaxis: Heparin subcutaneous Code Status: Full code Family Communication: No family at bedside Disposition Plan: Currently admitted    Consultants:   Nephrology  Procedures: None Antimicrobials: None  Subjective: Seen and examined at bedside. Denied headache, dizziness. Shortness of breath is much better. No chest pain, nausea or vomiting.  Objective: Vitals:   08/27/17 0515 08/27/17 0610 08/27/17 0829 08/27/17 0957  BP: (!) 156/79 (!) 179/67 (!) 171/73   Pulse: 81 90  98   Resp:  (!) 26  (!) 22  Temp:  97.6 F (36.4 C) (!) 97.5 F (36.4 C)   TempSrc:  Axillary Oral   SpO2:  97%  95%  Weight:      Height:        Intake/Output Summary (Last 24 hours) at 08/27/17 1132 Last data filed at 08/27/17 1000  Gross per 24 hour  Intake              150 ml  Output             4000 ml  Net            -3850 ml   Filed Weights   08/26/17 1953 08/27/17 0102 08/27/17 0512  Weight: 66.7 kg (147 lb) 66.6 kg (146 lb 13.2 oz) 62.6 kg (138 lb 0.1 oz)    Examination:  General exam: Appears calm and comfortable  Respiratory system: Bilateral diffuse wheezes, is reported effort normal. Basal crackles Cardiovascular system: S1 & S2 heard, RRR.  No pedal edema. Gastrointestinal system: Abdomen is nondistended, soft and nontender. Normal bowel sounds heard. Central nervous system: Alert and oriented. No focal neurological deficits. Extremities: Symmetric 5 x 5 power. Skin: No rashes, lesions or ulcers Psychiatry: Judgement and insight appear normal. Mood & affect appropriate.     Data Reviewed: I have personally reviewed following labs and imaging studies  CBC:  Recent Labs Lab 08/26/17 2127 08/26/17 2139 08/27/17 0645  WBC 7.4  --  7.8  NEUTROABS 6.0  --   --   HGB 10.6* 10.2* 11.2*  HCT 31.1* 30.0* 33.2*  MCV 87.9  --  86.7  PLT 160  --  326   Basic Metabolic Panel:  Recent Labs Lab 08/26/17 2127 08/26/17 2139 08/27/17 0645  NA 139  141 137  K 3.8 3.8 3.9  CL 103 103 97*  CO2 22  --  29  GLUCOSE 123* 120* 92  BUN 36* 40* 9  CREATININE 7.23* 7.40* 3.09*  CALCIUM 8.8*  --  8.7*   GFR: Estimated Creatinine Clearance: 17.4 mL/min (A) (by C-G formula based on SCr of 3.09 mg/dL (H)). Liver Function Tests:  Recent Labs Lab 08/26/17 2127  AST 18  ALT 11*  ALKPHOS 115  BILITOT 0.6  PROT 7.1  ALBUMIN 3.9   No results for input(s): LIPASE, AMYLASE in the last 168 hours. No results for input(s): AMMONIA in the last 168 hours. Coagulation  Profile: No results for input(s): INR, PROTIME in the last 168 hours. Cardiac Enzymes: No results for input(s): CKTOTAL, CKMB, CKMBINDEX, TROPONINI in the last 168 hours. BNP (last 3 results) No results for input(s): PROBNP in the last 8760 hours. HbA1C: No results for input(s): HGBA1C in the last 72 hours. CBG: No results for input(s): GLUCAP in the last 168 hours. Lipid Profile: No results for input(s): CHOL, HDL, LDLCALC, TRIG, CHOLHDL, LDLDIRECT in the last 72 hours. Thyroid Function Tests:  Recent Labs  08/27/17 0645  TSH 1.704   Anemia Panel: No results for input(s): VITAMINB12, FOLATE, FERRITIN, TIBC, IRON, RETICCTPCT in the last 72 hours. Sepsis Labs: No results for input(s): PROCALCITON, LATICACIDVEN in the last 168 hours.  No results found for this or any previous visit (from the past 240 hour(s)).       Radiology Studies: Dg Chest 2 View  Result Date: 08/26/2017 CLINICAL DATA:  Shortness of breath EXAM: CHEST  2 VIEW COMPARISON:  PET-CT 08/08/2017, chest radiograph 07/29/2017 FINDINGS: Right upper lobe pulmonary nodule measuring 2.1 cm. Small bilateral pleural effusions. Mild cardiomegaly with diffuse interstitial and alveolar opacity which may reflect edema. Unable to exclude bibasilar pneumonia. Aortic atherosclerosis. No pneumothorax. Right subclavian vascular stent IMPRESSION: 1. Mild cardiomegaly with bilateral pleural effusions. Right greater than left interstitial and alveolar opacities suspect for edema. Unable to exclude bibasilar pneumonia 2. Stable right upper lobe pulmonary mass, please see prior PET-CT report Electronically Signed   By: Donavan Foil M.D.   On: 08/26/2017 20:53        Scheduled Meds: . amLODipine  10 mg Oral Daily  . aspirin EC  81 mg Oral Daily  . busPIRone  5 mg Oral BID  . carvedilol  6.25 mg Oral BID WC  . doxazosin  4 mg Oral QHS  . ferric citrate  420 mg Oral QAC supper  . fluticasone furoate-vilanterol  1 puff Inhalation  Daily  . furosemide  40 mg Oral Daily  . heparin  5,000 Units Subcutaneous Q8H  . [START ON 08/28/2017] Influenza vac split quadrivalent PF  0.5 mL Intramuscular Tomorrow-1000  . isosorbide mononitrate  120 mg Oral Daily  . losartan  100 mg Oral Daily  . pantoprazole  40 mg Oral Daily  . tamsulosin  0.4 mg Oral Daily  . temazepam  15 mg Oral QHS   Continuous Infusions:   LOS: 0 days    Patrick Eblin Tanna Furry, MD [facility] Pager [phone]  If 7PM-7AM, please contact night-coverage [email or web address] Password [facility] 08/27/2017, 11:32 AM

## 2017-08-27 NOTE — Progress Notes (Signed)
[facility] Progress Note  Subjective: breathing a "whole lot better', no CP, no abd pain.  Got SOB getting up to bedside commode  Vitals:   08/27/17 0515 08/27/17 0610 08/27/17 0829 08/27/17 0957  BP: (!) 156/79 (!) 179/67 (!) 171/73   Pulse: 81 90  98  Resp:  (!) 26  (!) 22  Temp:  97.6 F (36.4 C) (!) 97.5 F (36.4 C)   TempSrc:  Axillary Oral   SpO2:  97%  95%  Weight:      Height:        Inpatient medications: . amLODipine  10 mg Oral Daily  . aspirin EC  81 mg Oral Daily  . busPIRone  5 mg Oral BID  . carvedilol  6.25 mg Oral BID WC  . doxazosin  4 mg Oral QHS  . ferric citrate  420 mg Oral QAC supper  . fluticasone furoate-vilanterol  1 puff Inhalation Daily  . furosemide  40 mg Oral Daily  . heparin  5,000 Units Subcutaneous Q8H  . [START ON 08/28/2017] Influenza vac split quadrivalent PF  0.5 mL Intramuscular Tomorrow-1000  . isosorbide mononitrate  120 mg Oral Daily  . losartan  100 mg Oral Daily  . pantoprazole  40 mg Oral Daily  . tamsulosin  0.4 mg Oral Daily  . temazepam  15 mg Oral QHS   . sodium chloride    . sodium chloride     sodium chloride, sodium chloride, acetaminophen **OR** acetaminophen, albuterol, alteplase, heparin, heparin, lidocaine (PF), lidocaine-prilocaine, LORazepam, pentafluoroprop-tetrafluoroeth  Exam: Gen alert, looks better, nasal O2 No jvd Chest scant rales at bases o/w clear RRR no MRG Abd soft ntnd no mass or ascites +bs GU normal male MS no joint effusions or deformity, no wounds or ulcers Ext no LE edema Neuro is alert, Ox 3 , nf  Home meds: - norvasc/ coreg 6.25 bid/ lasix 40 qd/ losartan 100 qd/ cardura 4 hs - alb nebs/ breo ellipta qd/ symbicort inhaler - restoril 15 hs/ ativan 0.5 prn/ buspar 5 bid - ecasa/ imdur 120 qd - auryxia ac/ PPI/ flomax  Dialysis: MWF [facility]  Dry wt 147 lbs per pt    Impression: 1.  Dyspnea/ pulm edema - good HD compliance, losing body wt prob due to  cancer.  2.  ESRD on HD.  Still has extra vol, plan HD again in the morning.  3.  Recent dx of possible lung Ca - per PET scan according to pt 4.  CAD hx cor stent x 2 5.  COPD - on mult inhalers 6.  HTN - on 4 BP meds   Plan - HD Tuesday am off sched, wean O2 as tol.  Prob ok for dc after HD tomorrow.    Kelly Splinter MD [facility] pager [phone]   08/27/2017, 10:33 AM    Recent Labs Lab 08/26/17 2127 08/26/17 2139 08/27/17 0645  NA 139 141 137  K 3.8 3.8 3.9  CL 103 103 97*  CO2 22  --  29  GLUCOSE 123* 120* 92  BUN 36* 40* 9  CREATININE 7.23* 7.40* 3.09*  CALCIUM 8.8*  --  8.7*    Recent Labs Lab 08/26/17 2127  AST 18  ALT 11*  ALKPHOS 115  BILITOT 0.6  PROT 7.1  ALBUMIN 3.9    Recent Labs Lab 08/26/17 2127 08/26/17 2139 08/27/17 0645  WBC 7.4  --  7.8  NEUTROABS 6.0  --   --  HGB 10.6* 10.2* 11.2*  HCT 31.1* 30.0* 33.2*  MCV 87.9  --  86.7  PLT 160  --  164   Iron/TIBC/Ferritin/ %Sat No results found for: IRON, TIBC, FERRITIN, IRONPCTSAT

## 2017-08-27 NOTE — Care Management Note (Addendum)
Case Management Note Marvetta Gibbons RN, BSN Unit 4E-Case Manager [phone]  Patient Details  Name: Patrick Molina MRN: [id number] Date of Birth: January 29, 1941  Subjective/Objective:   Pt admitted with Dyspnea/acute pulmonary edema  Hx of HD on M/W/F [facility])                Action/Plan: PTA pt lived at home alone, anticipate return home- CM to follow for d/c needs  Expected Discharge Date:                  Expected Discharge Plan:  Home/Self Care  In-House Referral:     Discharge planning Services  CM Consult  Post Acute Care Choice:    Choice offered to:     DME Arranged:    DME Agency:     HH Arranged:    HH Agency:     Status of Service:  In process, will continue to follow  If discussed at Long Length of Stay Meetings, dates discussed:    Discharge Disposition:   Additional Comments:  [name], RN 08/27/2017, 4:28 PM

## 2017-08-27 NOTE — ED Notes (Signed)
Gave report to Loleta Dicker, RN dialysis

## 2017-08-27 NOTE — Progress Notes (Signed)
Dialysis treatment completed.  4600 mL ultrafiltrated and net fluid removal 4000 mL.    Patient status unchanged. Lung sounds with expiratory wheezes to ausculation in all fields. Generalized non pitting edema. Cardiac: NSR.  Disconnected lines and removed needles.  Pressure held for 10 minutes and band aid/gauze dressing applied.  Report given to bedside RN, Tim.

## 2017-08-27 NOTE — Progress Notes (Signed)
Mask removed and 7 liters [place] applied.  O2 sats remained steady, never dropping below 96%.  However, patient reported inability to breath and became extremely anxious.  Mask reapplied with flow rate of 10.  After five minutes of more comfortable breathing, flow rate titrated to 7 with patient denying dsypnea.  Will re attempt [place] in approximately 1 hour.  Will continue to monitor.

## 2017-08-27 NOTE — Plan of Care (Signed)
Problem: Physical Regulation: Goal: Ability to maintain clinical measurements within normal limits will improve Outcome: Progressing VSS

## 2017-08-27 NOTE — Progress Notes (Signed)
Patient arrived to unit per ED stretcher.  Reviewed treatment plan and this RN agrees.  Report received from bedside RN, Matt.  Consent obtained.  Patient A & O X 4. Lung sounds diminished and clear to ausculation in all fields. BLE 2+ pitting edema. Cardiac: NSR.  Prepped RUAVF with alcohol and cannulated with two 15 gauge needles.  Pulsation of blood noted.  Flushed access well with saline per protocol.  Connected and secured lines and initiated tx at 0112.  UF goal of 5500 mL and net fluid removal of 5000 mL.  Will continue to monitor.

## 2017-08-27 NOTE — ED Provider Notes (Signed)
[facility] DEPT Provider Note   CSN: [id number] Arrival date & time: 08/26/17  1950     History   Chief Complaint Chief Complaint  Patient presents with  . Shortness of Breath    HPI Patrick Molina is a 76 y.o. male.She complained shortness of breath. History of end-stage renal disease on dialysis  HPI:  76 year old male. History of end-stage renal disease on Monday, Wednesday, Friday hemodialysis. Is compliant. On multiple blood pressure medications and diuretics. States he is compliant with this as well. Dialyzed Friday. No unusual dietary or fluid intake since then. He is to help lower extremity edema over last 48 hours and shortness of breath. Typically is treated in [facility]. Typically prefers [facility]. However, due to large volume and apparently include whether they were on divert and he was brought here by EMS tonight for evaluation.  Past Medical History:  Diagnosis Date  . Cancer ([place])   . CHF (congestive heart failure) ([place])   . COPD (chronic obstructive pulmonary disease) ([place])   . Renal disorder     There are no active problems to display for this patient.   No past surgical history on file.     Home Medications    Prior to Admission medications   Medication Sig Start Date End Date Taking? Authorizing Provider  albuterol (PROVENTIL HFA;VENTOLIN HFA) 108 (90 Base) MCG/ACT inhaler Inhale 2 puffs into the lungs every 6 (six) hours as needed for wheezing or shortness of breath.   Yes [provider]  amLODipine (NORVASC) 10 MG tablet Take 10 mg by mouth daily. 08/22/17  Yes [provider]  aspirin EC 81 MG tablet Take 81 mg by mouth daily.   Yes [provider]  busPIRone (BUSPAR) 5 MG tablet Take 5 mg by mouth 2 (two) times daily. 08/14/17  Yes [provider]  carvedilol (COREG) 6.25 MG tablet Take 6.25 mg by mouth 2 (two) times daily. 08/09/17  Yes [provider]  doxazosin (CARDURA) 4 MG tablet  Take 4 mg by mouth at bedtime. 07/16/17  Yes [provider]  ferric citrate (AURYXIA) 1 GM 210 MG(Fe) tablet Take 420 mg by mouth daily.   Yes [provider]  fluticasone furoate-vilanterol (BREO ELLIPTA) 100-25 MCG/INH AEPB Inhale 1 puff into the lungs as needed.    Yes [provider]  furosemide (LASIX) 40 MG tablet Take 40 mg by mouth daily. 08/09/17  Yes [provider]  isosorbide mononitrate (IMDUR) 120 MG 24 hr tablet Take 120 mg by mouth daily. 08/07/17  Yes [provider]  LORazepam (ATIVAN) 0.5 MG tablet Take 0.5 mg by mouth daily as needed. 08/23/17  Yes [provider]  losartan (COZAAR) 100 MG tablet Take 100 mg by mouth daily. 07/11/17  Yes [provider]  pantoprazole (PROTONIX) 40 MG tablet Take 40 mg by mouth daily. 08/09/17  Yes [provider]  SYMBICORT 80-4.5 MCG/ACT inhaler Inhale 2 puffs into the lungs daily as needed. 08/09/17  Yes [provider]  tamsulosin (FLOMAX) 0.4 MG CAPS capsule Take 0.4 mg by mouth daily. d 07/08/17  Yes [provider]  temazepam (RESTORIL) 15 MG capsule Take 15 mg by mouth at bedtime. 08/20/17  Yes [provider]    Family History No family history on file.  Social History Social History  Substance Use Topics  . Smoking status: Not on file  . Smokeless tobacco: Not on file  . Alcohol use Not on file  Allergies   Aricept [donepezil hcl]; Haldol [haloperidol lactate]; and Prednisone   Review of Systems Review of Systems  Constitutional: Negative for appetite change, chills, diaphoresis, fatigue and fever.  HENT: Negative for mouth sores, sore throat and trouble swallowing.   Eyes: Negative for visual disturbance.  Respiratory: Positive for shortness of breath. Negative for cough, chest tightness and wheezing.   Cardiovascular: Negative for chest pain.  Gastrointestinal: Negative for abdominal distention, abdominal pain, diarrhea,  nausea and vomiting.  Endocrine: Negative for polydipsia, polyphagia and polyuria.  Genitourinary: Negative for dysuria, frequency and hematuria.  Musculoskeletal: Negative for gait problem.  Skin: Negative for color change, pallor and rash.  Neurological: Negative for dizziness, syncope, light-headedness and headaches.  Hematological: Does not bruise/bleed easily.  Psychiatric/Behavioral: Negative for behavioral problems and confusion.     Physical Exam Updated Vital Signs BP (!) 180/72   Pulse 94   Temp (!) 97.5 F (36.4 C) (Oral)   Resp (!) 31   Ht 5\' 5"  (1.651 m)   Wt 66.7 kg (147 lb)   SpO2 100%   BMI 24.46 kg/m   Physical Exam  Constitutional: He is oriented to person, place, and time. He appears well-developed and well-nourished. No distress.  HENT:  Head: Normocephalic.  Eyes: Pupils are equal, round, and reactive to light. Conjunctivae are normal. No scleral icterus.  Neck: Normal range of motion. Neck supple. No thyromegaly present.  Cardiovascular: Normal rate and regular rhythm.  Exam reveals no gallop and no friction rub.   No murmur heard. No S3 gallop.  Pulmonary/Chest: Effort normal and breath sounds normal. No respiratory distress. He has no wheezes. He has no rales.  Crackles bilaterally lung fields and diminished basilar breath sounds.  Abdominal: Soft. Bowel sounds are normal. He exhibits no distension. There is no tenderness. There is no rebound.  Musculoskeletal: Normal range of motion.  Neurological: He is alert and oriented to person, place, and time.  Skin: Skin is warm and dry. No rash noted.  1+ symmetric lower extremity edema  Psychiatric: He has a normal mood and affect. His behavior is normal.     ED Treatments / Results  Labs (all labs ordered are listed, but only abnormal results are displayed) Labs Reviewed  CBC WITH DIFFERENTIAL/PLATELET - Abnormal; Notable for the following:       Result Value   RBC 3.54 (*)    Hemoglobin 10.6 (*)     HCT 31.1 (*)    RDW 15.9 (*)    All other components within normal limits  COMPREHENSIVE METABOLIC PANEL - Abnormal; Notable for the following:    Glucose, Bld 123 (*)    BUN 36 (*)    Creatinine, Ser 7.23 (*)    Calcium 8.8 (*)    ALT 11 (*)    GFR calc non Af Amer 6 (*)    GFR calc Af Amer 7 (*)    All other components within normal limits  I-STAT CHEM 8, ED - Abnormal; Notable for the following:    BUN 40 (*)    Creatinine, Ser 7.40 (*)    Glucose, Bld 120 (*)    Calcium, Ion 1.05 (*)    Hemoglobin 10.2 (*)    HCT 30.0 (*)    All other components within normal limits    EKG  EKG Interpretation None       Radiology Dg Chest 2 View  Result Date: 08/26/2017 CLINICAL DATA:  Shortness of breath EXAM: CHEST  2 VIEW COMPARISON:  PET-CT 08/08/2017, chest radiograph 07/29/2017 FINDINGS: Right upper lobe pulmonary nodule measuring 2.1 cm. Small bilateral pleural effusions. Mild cardiomegaly with diffuse interstitial and alveolar opacity which may reflect edema. Unable to exclude bibasilar pneumonia. Aortic atherosclerosis. No pneumothorax. Right subclavian vascular stent IMPRESSION: 1. Mild cardiomegaly with bilateral pleural effusions. Right greater than left interstitial and alveolar opacities suspect for edema. Unable to exclude bibasilar pneumonia 2. Stable right upper lobe pulmonary mass, please see prior PET-CT report Electronically Signed   By: Donavan Foil M.D.   On: 08/26/2017 20:53    Procedures Procedures (including critical care time)  Medications Ordered in ED Medications  LORazepam (ATIVAN) tablet 0.5 mg (0.5 mg Oral Given 08/27/17 0003)     Initial Impression / Assessment and Plan / ED Course  I have reviewed the triage vital signs and the nursing notes.  Pertinent labs & imaging results that were available during my care of the patient were reviewed by me and considered in my medical decision making (see chart for details).    Patient's mask removed. He  within 30 seconds began study saturating become acutely symptomatic and dyspneic and anxious and panicky. Maintains 100% on high flow O2. Discussed the case with Dr. Jonnie Finner of nephrology. He'll be admitted. Also discussed with [facility] hospitalist.  Final Clinical Impressions(s) / ED Diagnoses   Final diagnoses:  Acute congestive heart failure, unspecified heart failure type [facility])    New Prescriptions New Prescriptions   No medications on file     Tanna Furry, MD 08/27/17 0011

## 2017-08-28 DIAGNOSIS — I10 Essential (primary) hypertension: Secondary | ICD-10-CM

## 2017-08-28 LAB — CBC
HCT: 30 % — ABNORMAL LOW (ref 39.0–52.0)
Hemoglobin: 10 g/dL — ABNORMAL LOW (ref 13.0–17.0)
MCH: 28.8 pg (ref 26.0–34.0)
MCHC: 33.3 g/dL (ref 30.0–36.0)
MCV: 86.5 fL (ref 78.0–100.0)
PLATELETS: 151 10*3/uL (ref 150–400)
RBC: 3.47 MIL/uL — AB (ref 4.22–5.81)
RDW: 15.5 % (ref 11.5–15.5)
WBC: 5.6 10*3/uL (ref 4.0–10.5)

## 2017-08-28 LAB — RENAL FUNCTION PANEL
Albumin: 3.4 g/dL — ABNORMAL LOW (ref 3.5–5.0)
Anion gap: 11 (ref 5–15)
BUN: 28 mg/dL — ABNORMAL HIGH (ref 6–20)
CALCIUM: 8.6 mg/dL — AB (ref 8.9–10.3)
CO2: 25 mmol/L (ref 22–32)
CREATININE: 5.57 mg/dL — AB (ref 0.61–1.24)
Chloride: 100 mmol/L — ABNORMAL LOW (ref 101–111)
GFR, EST AFRICAN AMERICAN: 10 mL/min — AB (ref >60)
GFR, EST NON AFRICAN AMERICAN: 9 mL/min — AB (ref >60)
Glucose, Bld: 86 mg/dL (ref 65–99)
PHOSPHORUS: 2.6 mg/dL (ref 2.5–4.6)
Potassium: 3.9 mmol/L (ref 3.5–5.1)
SODIUM: 136 mmol/L (ref 135–145)

## 2017-08-28 LAB — HEPATITIS B SURFACE ANTIGEN: HEP B S AG: NEGATIVE

## 2017-08-28 MED ORDER — LIDOCAINE-PRILOCAINE 2.5-2.5 % EX CREA: 1.0000 "application " | TOPICAL_CREAM | CUTANEOUS | Status: DC | PRN

## 2017-08-28 MED ORDER — HEPARIN SODIUM (PORCINE) 1000 UNIT/ML DIALYSIS: 1000.0000 [IU] | INTRAMUSCULAR | Status: DC | PRN

## 2017-08-28 MED ORDER — PENTAFLUOROPROP-TETRAFLUOROETH EX AERO: 1.0000 "application " | INHALATION_SPRAY | CUTANEOUS | Status: DC | PRN

## 2017-08-28 MED ORDER — SODIUM CHLORIDE 0.9 % IV SOLN: 100.0000 mL | INTRAVENOUS | Status: DC | PRN

## 2017-08-28 MED ORDER — LIDOCAINE HCL (PF) 1 % IJ SOLN: 5.0000 mL | INTRAMUSCULAR | Status: DC | PRN

## 2017-08-28 MED ORDER — ALTEPLASE 2 MG IJ SOLR: 2.0000 mg | Freq: Once | INTRAMUSCULAR | Status: DC | PRN

## 2017-08-28 MED ORDER — HEPARIN SODIUM (PORCINE) 1000 UNIT/ML DIALYSIS: 3000.0000 [IU] | INTRAMUSCULAR | Status: DC | PRN

## 2017-08-28 NOTE — Discharge Summary (Signed)
Physician Discharge Summary  Patrick Molina WUJ:[id number] DOB: 1941/01/18 DOA: 08/26/2017  PCP: Patient, No Pcp Per  Admit date: 08/26/2017 Discharge date: 08/28/2017  Admitted From:home Disposition:home  Recommendations for Outpatient Follow-up:  1. Follow up with PCP in 1-2 weeks 2. Please obtain BMP/CBC in one week  Home Health:no Equipment/Devices:no Discharge Condition:stable CODE STATUS:full code Diet recommendation:heart healthy  Brief/Interim Summary: 76 year old male with recent diagnosis of possible lung cancer, ESRD on hemodialysis, hypertension, coronary artery disease, COPD not on oxygen, presented with shortness of breath and dyspnea on exertion consistent with pulmonary edema.  # Dyspnea/acute pulmonary edema: Reports good compliance with the hemodialysis. Likely needs to adjust hhis dry weight.  - Patient had echocardiogram at [facility] health care on 8/21 with mild concentric left ventricle hypertrophy, EF 50-55%. -patient received 2 hemodialysis treatment daily with significant clinical improvement. Denies shortness of breath and patient has not hypoxia. Patient had significant ultrafiltration during dialysis as per nephrologist. -recommended low salt diet.  #Acute respiratory failure with hypoxia: In the setting of pulmonary edema/fluid overload. chest x-ray consistent with fluid overload, cardiomegaly. Clinically improved after hemodialysis. On room air. Lungs clear on exam. Denied shortness of breath or dyspnea.  #Right lung cancer and had PET scan recently. Recommended to follow-up outpatient.  #COPD: Stable. Continue nebulizer and breathing treatment. Not on oxygen.  #Hypertension:  Continue amlodipine, Coreg, Cardura, Imdur, Cozaar. Monitor blood pressure closely.   ESRD on hemodialysis: Received dialysis yesterday and today with significant clinical improvement. Discussed with Dr. Jonnie Finner from nephrology. Plan for next dialysis treatment tomorrow at outpatient  facility.  Discharge Diagnoses:  Active Problems:   Pulmonary edema   Respiratory failure [facility])    Discharge Instructions  Discharge Instructions    Call MD for:  difficulty breathing, headache or visual disturbances    Complete by:  As directed    Call MD for:  extreme fatigue    Complete by:  As directed    Call MD for:  hives    Complete by:  As directed    Call MD for:  persistant dizziness or light-headedness    Complete by:  As directed    Call MD for:  persistant nausea and vomiting    Complete by:  As directed    Call MD for:  severe uncontrolled pain    Complete by:  As directed    Call MD for:  temperature >100.4    Complete by:  As directed    Diet - low sodium heart healthy    Complete by:  As directed    Discharge instructions    Complete by:  As directed    Please follow up with your PCP in one week, please continue your dialysis treatment as per your nephrologist, next dialysis 08/29/2017   Increase activity slowly    Complete by:  As directed      Allergies as of 08/28/2017      Reactions   Aricept [donepezil Hcl] Other (See Comments)   Makes him crazy   Haldol [haloperidol Lactate]    Prednisone       Medication List    TAKE these medications   albuterol 108 (90 Base) MCG/ACT inhaler Commonly known as:  PROVENTIL HFA;VENTOLIN HFA Inhale 2 puffs into the lungs every 6 (six) hours as needed for wheezing or shortness of breath.   amLODipine 10 MG tablet Commonly known as:  NORVASC Take 10 mg by mouth daily.   aspirin EC 81 MG tablet Take 81 mg by mouth  daily.   AURYXIA 1 GM 210 MG(Fe) tablet Generic drug:  ferric citrate Take 420 mg by mouth daily.   BREO ELLIPTA 100-25 MCG/INH Aepb Generic drug:  fluticasone furoate-vilanterol Inhale 1 puff into the lungs as needed.   busPIRone 5 MG tablet Commonly known as:  BUSPAR Take 5 mg by mouth 2 (two) times daily.   carvedilol 6.25 MG tablet Commonly known as:  COREG Take 6.25 mg by mouth 2  (two) times daily.   doxazosin 4 MG tablet Commonly known as:  CARDURA Take 4 mg by mouth at bedtime.   furosemide 40 MG tablet Commonly known as:  LASIX Take 40 mg by mouth daily.   isosorbide mononitrate 120 MG 24 hr tablet Commonly known as:  IMDUR Take 120 mg by mouth daily.   LORazepam 0.5 MG tablet Commonly known as:  ATIVAN Take 0.5 mg by mouth daily as needed.   losartan 100 MG tablet Commonly known as:  COZAAR Take 100 mg by mouth daily.   pantoprazole 40 MG tablet Commonly known as:  PROTONIX Take 40 mg by mouth daily.   SYMBICORT 80-4.5 MCG/ACT inhaler Generic drug:  budesonide-formoterol Inhale 2 puffs into the lungs daily as needed.   tamsulosin 0.4 MG Caps capsule Commonly known as:  FLOMAX Take 0.4 mg by mouth daily. d   temazepam 15 MG capsule Commonly known as:  RESTORIL Take 15 mg by mouth at bedtime.            Discharge Care Instructions        Start     Ordered   08/28/17 0000  Increase activity slowly     08/28/17 1132   08/28/17 0000  Diet - low sodium heart healthy     08/28/17 1132   08/28/17 0000  Discharge instructions    Comments:  Please follow up with your PCP in one week, please continue your dialysis treatment as per your nephrologist, next dialysis 08/29/2017   08/28/17 1132   08/28/17 0000  Call MD for:  temperature >100.4     08/28/17 1132   08/28/17 0000  Call MD for:  persistant nausea and vomiting     08/28/17 1132   08/28/17 0000  Call MD for:  severe uncontrolled pain     08/28/17 1132   08/28/17 0000  Call MD for:  difficulty breathing, headache or visual disturbances     08/28/17 1132   08/28/17 0000  Call MD for:  hives     08/28/17 1132   08/28/17 0000  Call MD for:  persistant dizziness or light-headedness     08/28/17 1132   08/28/17 0000  Call MD for:  extreme fatigue     08/28/17 1132      Allergies  Allergen Reactions  . Aricept [Donepezil Hcl] Other (See Comments)    Makes him crazy  . Haldol  [Haloperidol Lactate]   . Prednisone     Consultations: nephrology  Procedures/Studies: none  Subjective: Seen and examined at dialysis unit. Patient reported feeling much better. Denied headache, dizziness, nausea, vomiting, chest pain, shortness of breath. No abdominal pain.  Discharge Exam: Vitals:   08/28/17 1000 08/28/17 1037  BP: 127/66 104/63  Pulse: 87 87  Resp:  20  Temp:  98 F (36.7 C)  SpO2:  98%   Vitals:   08/28/17 0900 08/28/17 0930 08/28/17 1000 08/28/17 1037  BP: (!) 145/73 135/71 127/66 104/63  Pulse: 85 84 87 87  Resp:    20  Temp:    98 F (36.7 C)  TempSrc:    Oral  SpO2:    98%  Weight:    58 kg (127 lb 13.9 oz)  Height:        General: Pt is alert, awake, not in acute distress Cardiovascular: RRR, S1/S2 +, no rubs, no gallops Respiratory: CTA bilaterally, no wheezing, no rhonchi Abdominal: Soft, NT, ND, bowel sounds + Extremities: no edema, no cyanosis    The results of significant diagnostics from this hospitalization (including imaging, microbiology, ancillary and laboratory) are listed below for reference.     Microbiology: No results found for this or any previous visit (from the past 240 hour(s)).   Labs: BNP (last 3 results) No results for input(s): BNP in the last 8760 hours. Basic Metabolic Panel:  Recent Labs Lab 08/26/17 2127 08/26/17 2139 08/27/17 0645 08/28/17 0707  NA 139 141 137 136  K 3.8 3.8 3.9 3.9  CL 103 103 97* 100*  CO2 22  --  29 25  GLUCOSE 123* 120* 92 86  BUN 36* 40* 9 28*  CREATININE 7.23* 7.40* 3.09* 5.57*  CALCIUM 8.8*  --  8.7* 8.6*  PHOS  --   --   --  2.6   Liver Function Tests:  Recent Labs Lab 08/26/17 2127 08/28/17 0707  AST 18  --   ALT 11*  --   ALKPHOS 115  --   BILITOT 0.6  --   PROT 7.1  --   ALBUMIN 3.9 3.4*   No results for input(s): LIPASE, AMYLASE in the last 168 hours. No results for input(s): AMMONIA in the last 168 hours. CBC:  Recent Labs Lab 08/26/17 2127  08/26/17 2139 08/27/17 0645 08/28/17 0707  WBC 7.4  --  7.8 5.6  NEUTROABS 6.0  --   --   --   HGB 10.6* 10.2* 11.2* 10.0*  HCT 31.1* 30.0* 33.2* 30.0*  MCV 87.9  --  86.7 86.5  PLT 160  --  164 151   Cardiac Enzymes: No results for input(s): CKTOTAL, CKMB, CKMBINDEX, TROPONINI in the last 168 hours. BNP: Invalid input(s): POCBNP CBG: No results for input(s): GLUCAP in the last 168 hours. D-Dimer No results for input(s): DDIMER in the last 72 hours. Hgb A1c No results for input(s): HGBA1C in the last 72 hours. Lipid Profile No results for input(s): CHOL, HDL, LDLCALC, TRIG, CHOLHDL, LDLDIRECT in the last 72 hours. Thyroid function studies  Recent Labs  08/27/17 0645  TSH 1.704   Anemia work up No results for input(s): VITAMINB12, FOLATE, FERRITIN, TIBC, IRON, RETICCTPCT in the last 72 hours. Urinalysis No results found for: COLORURINE, APPEARANCEUR, LABSPEC, [place], GLUCOSEU, HGBUR, BILIRUBINUR, KETONESUR, PROTEINUR, UROBILINOGEN, NITRITE, LEUKOCYTESUR Sepsis Labs Invalid input(s): PROCALCITONIN,  WBC,  LACTICIDVEN Microbiology No results found for this or any previous visit (from the past 240 hour(s)).   Time coordinating discharge: 28 minutes  SIGNED:   Rosita Fire, MD  [facility] 08/28/2017, 11:32 AM  If 7PM-7AM, please contact night-coverage [email or web address] Password [email or web address]

## 2017-08-28 NOTE — Progress Notes (Signed)
[facility] Progress Note  Subjective: much better, off of nasal O2, 20 lbs down after HD today under prior dry wt  Vitals:   08/28/17 0900 08/28/17 0930 08/28/17 1000 08/28/17 1037  BP: (!) 145/73 135/71 127/66 104/63  Pulse: 85 84 87 87  Resp:    20  Temp:    98 F (36.7 C)  TempSrc:    Oral  SpO2:    98%  Weight:    58 kg (127 lb 13.9 oz)  Height:        Inpatient medications: . amLODipine  10 mg Oral Daily  . aspirin EC  81 mg Oral Daily  . busPIRone  5 mg Oral BID  . carvedilol  6.25 mg Oral BID WC  . doxazosin  4 mg Oral QHS  . ferric citrate  420 mg Oral QAC supper  . fluticasone furoate-vilanterol  1 puff Inhalation Daily  . furosemide  40 mg Oral Daily  . heparin  5,000 Units Subcutaneous Q8H  . Influenza vac split quadrivalent PF  0.5 mL Intramuscular Tomorrow-1000  . isosorbide mononitrate  120 mg Oral Daily  . losartan  100 mg Oral Daily  . pantoprazole  40 mg Oral Daily  . tamsulosin  0.4 mg Oral Daily  . temazepam  15 mg Oral QHS    acetaminophen **OR** acetaminophen, albuterol, LORazepam  Exam: Gen alert, looks better, nasal O2 No jvd Chest scant rales at bases o/w clear RRR no MRG Abd soft ntnd no mass or ascites +bs GU normal male MS no joint effusions or deformity, no wounds or ulcers Ext no LE edema Neuro is alert, Ox 3 , nf  Home meds: - norvasc/ coreg 6.25 bid/ lasix 40 qd/ losartan 100 qd/ cardura 4 hs - alb nebs/ breo ellipta qd/ symbicort inhaler - restoril 15 hs/ ativan 0.5 prn/ buspar 5 bid - ecasa/ imdur 120 qd - auryxia ac/ PPI/ flomax  Dialysis: MWF [facility]  Dry wt 147 lbs per pt    Impression: 1.  Dyspnea/ pulm edema - good HD compliance, losing body wt prob due to cancer.  SOB resolved 2.  ESRD HD mwf - vol excess resolved , new dry wt 127 lbs, have called his OP unit. [place] for [employer].  3.  Recent dx of possible lung Ca - per PET scan according to pt 4.  CAD hx cor stent x 2 5.  COPD - on mult  inhalers 6.  HTN - on 4 BP meds   Plan - ok for dc now   Kelly Splinter MD [facility] pager [phone]   08/28/2017, 1:31 PM    Recent Labs Lab 08/26/17 2127 08/26/17 2139 08/27/17 0645 08/28/17 0707  NA 139 141 137 136  K 3.8 3.8 3.9 3.9  CL 103 103 97* 100*  CO2 22  --  29 25  GLUCOSE 123* 120* 92 86  BUN 36* 40* 9 28*  CREATININE 7.23* 7.40* 3.09* 5.57*  CALCIUM 8.8*  --  8.7* 8.6*  PHOS  --   --   --  2.6    Recent Labs Lab 08/26/17 2127 08/28/17 0707  AST 18  --   ALT 11*  --   ALKPHOS 115  --   BILITOT 0.6  --   PROT 7.1  --   ALBUMIN 3.9 3.4*    Recent Labs Lab 08/26/17 2127 08/26/17 2139 08/27/17 0645 08/28/17 0707  WBC 7.4  --  7.8 5.6  NEUTROABS 6.0  --   --   --  HGB 10.6* 10.2* 11.2* 10.0*  HCT 31.1* 30.0* 33.2* 30.0*  MCV 87.9  --  86.7 86.5  PLT 160  --  164 151   Iron/TIBC/Ferritin/ %Sat No results found for: IRON, TIBC, FERRITIN, IRONPCTSAT

## 2017-08-28 NOTE — Progress Notes (Signed)
Evorn Gong to be D/C'd Home per MD order. Discussed with the patient and all questions fully answered.    VVS, Skin clean, dry and intact without evidence of skin break down, no evidence of skin tears noted.  IV catheter discontinued intact. Site without signs and symptoms of complications. Dressing and pressure applied.  An After Visit Summary was printed and given to the patient.  Patient escorted via [place], and D/C home via private auto.  Cyndra Numbers  08/28/2017 3:12 PM

## 2017-08-28 NOTE — Care Management Note (Signed)
Case Management Note Marvetta Gibbons RN, BSN Unit 4E-Case Manager [phone]  Patient Details  Name: Patrick Molina MRN: [id number] Date of Birth: 19-Sep-1941  Subjective/Objective:   Pt admitted with Dyspnea/acute pulmonary edema  Hx of HD on M/W/F [facility])                Action/Plan: PTA pt lived at home alone, anticipate return home- CM to follow for d/c needs  Expected Discharge Date:  08/28/17               Expected Discharge Plan:  Home/Self Care  In-House Referral:  NA  Discharge planning Services  CM Consult  Post Acute Care Choice:  NA Choice offered to:  NA  DME Arranged:    DME Agency:     HH Arranged:    [place] Agency:     Status of Service:  Completed, signed off  If discussed at [place] of Stay Meetings, dates discussed:    Discharge Disposition: home/self care   Additional Comments:  08/28/17- 1400- Antoinette Borgwardt RN, CM- pt for d/c home today post HD- no CM needs noted for discharge.   [name], RN 08/28/2017, 2:04 PM

## 2017-08-29 LAB — HEPATITIS B SURFACE ANTIBODY,QUALITATIVE: HEP B S AB: NONREACTIVE

## 2018-09-10 DEATH — deceased

## 2018-09-27 IMAGING — DX DG CHEST 2V
2 series · 2 of 2 positions shown · non-contrast
Comparison: PET-CT 08/08/2017, chest radiograph 07/29/2017

CLINICAL DATA: Shortness of breath

EXAM:
CHEST  2 VIEW

[chest pa]
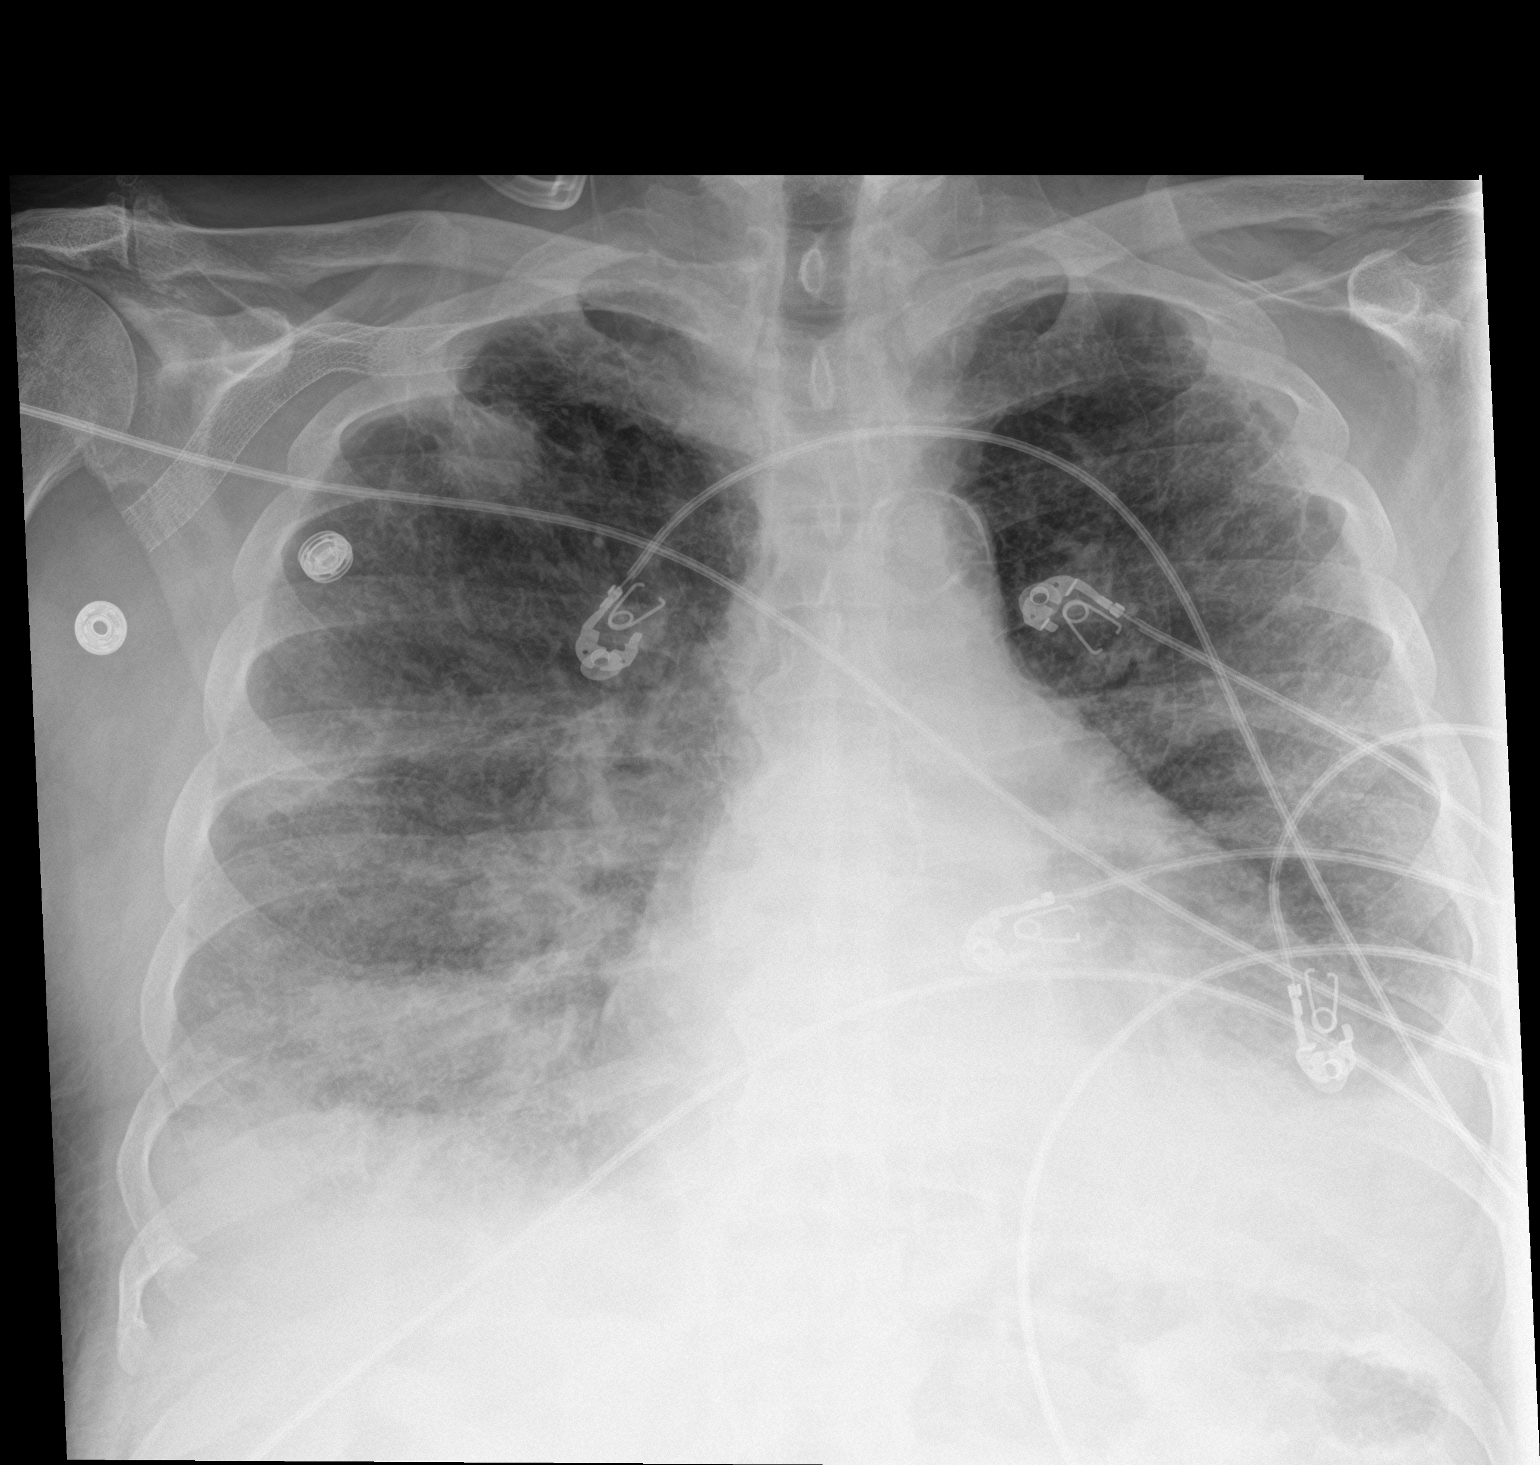

[chest lat]
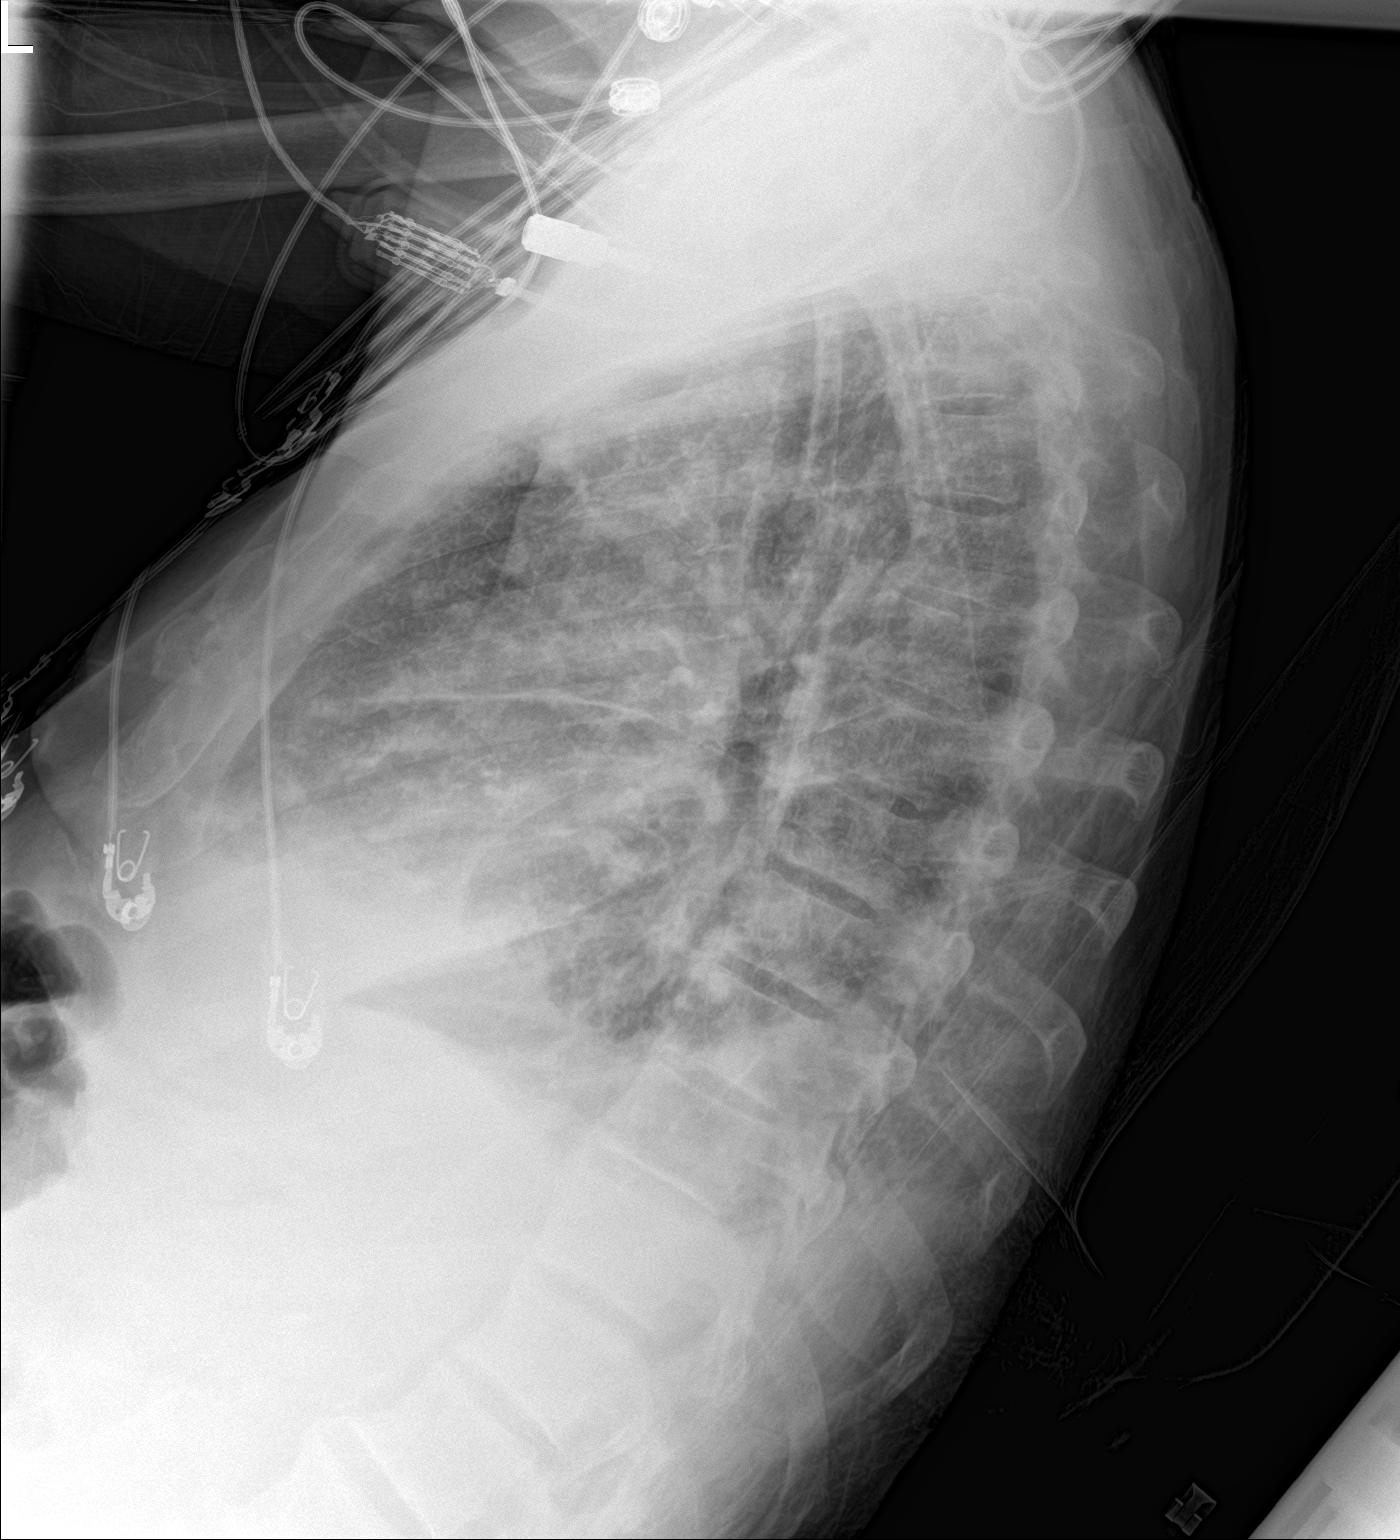

[2 of 2 positions shown; findings below may reference images not displayed]

FINDINGS: Right upper lobe pulmonary nodule measuring 2.1 cm. Small bilateral
pleural effusions. Mild cardiomegaly with diffuse interstitial and
alveolar opacity which may reflect edema. Unable to exclude
bibasilar pneumonia. Aortic atherosclerosis. No pneumothorax. Right
subclavian vascular stent
IMPRESSION: 1. Mild cardiomegaly with bilateral pleural effusions. Right greater
than left interstitial and alveolar opacities suspect for edema.
Unable to exclude bibasilar pneumonia
2. Stable right upper lobe pulmonary mass, please see prior PET-CT
report
